# Patient Record
Sex: Female | Born: 1965 | Race: White | Hispanic: No | Marital: Married | Smoking: Never smoker
Health system: Southern US, Community
[De-identification: ages and names within clinical notes are randomized; demographics above are authoritative.]

## PROBLEM LIST (undated history)

## (undated) DIAGNOSIS — Z972 Presence of dental prosthetic device (complete) (partial): Secondary | ICD-10-CM

## (undated) DIAGNOSIS — E7800 Pure hypercholesterolemia, unspecified: Secondary | ICD-10-CM

## (undated) DIAGNOSIS — G43909 Migraine, unspecified, not intractable, without status migrainosus: Secondary | ICD-10-CM

## (undated) DIAGNOSIS — E079 Disorder of thyroid, unspecified: Secondary | ICD-10-CM

## (undated) DIAGNOSIS — T753XXA Motion sickness, initial encounter: Secondary | ICD-10-CM

## (undated) HISTORY — PX: GALLBLADDER SURGERY: SHX652

## (undated) HISTORY — DX: Migraine, unspecified, not intractable, without status migrainosus: G43.909

## (undated) HISTORY — DX: Disorder of thyroid, unspecified: E07.9

## (undated) HISTORY — PX: TUBAL LIGATION: SHX77

## (undated) HISTORY — PX: TONSILLECTOMY AND ADENOIDECTOMY: SHX28

## (undated) HISTORY — PX: BREAST BIOPSY: SHX20

## (undated) HISTORY — DX: Pure hypercholesterolemia, unspecified: E78.00

---

## 2001-08-01 HISTORY — PX: ABDOMINAL HYSTERECTOMY: SHX81

## 2017-03-29 ENCOUNTER — Other Ambulatory Visit: Payer: Self-pay | Admitting: Nurse Practitioner

## 2017-03-29 DIAGNOSIS — Z1231 Encounter for screening mammogram for malignant neoplasm of breast: Secondary | ICD-10-CM

## 2017-04-24 ENCOUNTER — Ambulatory Visit
Admission: RE | Admit: 2017-04-24 | Discharge: 2017-04-24 | Disposition: A | Discharge status: Home / Self Care | Payer: BLUE CROSS/BLUE SHIELD | Source: Ambulatory Visit | Attending: Nurse Practitioner | Admitting: Nurse Practitioner

## 2017-04-24 DIAGNOSIS — Z1231 Encounter for screening mammogram for malignant neoplasm of breast: Secondary | ICD-10-CM | POA: Diagnosis present

## 2017-05-11 ENCOUNTER — Inpatient Hospital Stay
Admission: RE | Admit: 2017-05-11 | Discharge: 2017-05-11 | Disposition: A | Discharge status: Home / Self Care | Payer: Self-pay | Source: Ambulatory Visit | Attending: *Deleted | Admitting: *Deleted

## 2017-05-11 ENCOUNTER — Other Ambulatory Visit: Payer: Self-pay | Admitting: *Deleted

## 2017-05-11 DIAGNOSIS — Z9289 Personal history of other medical treatment: Secondary | ICD-10-CM

## 2017-09-13 ENCOUNTER — Ambulatory Visit (INDEPENDENT_AMBULATORY_CARE_PROVIDER_SITE_OTHER): Payer: BLUE CROSS/BLUE SHIELD | Admitting: Adult Health

## 2017-09-13 ENCOUNTER — Encounter: Payer: Self-pay | Admitting: Adult Health

## 2017-09-13 ENCOUNTER — Encounter (INDEPENDENT_AMBULATORY_CARE_PROVIDER_SITE_OTHER): Payer: Self-pay

## 2017-09-13 ENCOUNTER — Other Ambulatory Visit (HOSPITAL_COMMUNITY)
Admission: RE | Admit: 2017-09-13 | Discharge: 2017-09-13 | Disposition: A | Discharge status: Home / Self Care | Payer: BLUE CROSS/BLUE SHIELD | Source: Ambulatory Visit | Attending: Adult Health | Admitting: Adult Health

## 2017-09-13 VITALS — BP 110/62 | HR 82 | Ht <= 58 in | Wt 124.0 lb

## 2017-09-13 DIAGNOSIS — Z01419 Encounter for gynecological examination (general) (routine) without abnormal findings: Secondary | ICD-10-CM | POA: Diagnosis present

## 2017-09-13 DIAGNOSIS — Z1212 Encounter for screening for malignant neoplasm of rectum: Secondary | ICD-10-CM

## 2017-09-13 DIAGNOSIS — Z08 Encounter for follow-up examination after completed treatment for malignant neoplasm: Secondary | ICD-10-CM | POA: Diagnosis not present

## 2017-09-13 DIAGNOSIS — Z1211 Encounter for screening for malignant neoplasm of colon: Secondary | ICD-10-CM | POA: Diagnosis not present

## 2017-09-13 DIAGNOSIS — Z90710 Acquired absence of both cervix and uterus: Secondary | ICD-10-CM

## 2017-09-13 DIAGNOSIS — K581 Irritable bowel syndrome with constipation: Secondary | ICD-10-CM | POA: Diagnosis not present

## 2017-09-13 DIAGNOSIS — Z01411 Encounter for gynecological examination (general) (routine) with abnormal findings: Secondary | ICD-10-CM | POA: Diagnosis not present

## 2017-09-13 LAB — HEMOCCULT GUIAC POC 1CARD (OFFICE): Fecal Occult Blood, POC: NEGATIVE

## 2017-09-13 MED ORDER — LINACLOTIDE 72 MCG PO CAPS: 72.0000 ug | ORAL_CAPSULE | Freq: Every day | ORAL | 6 refills | Status: DC

## 2017-09-13 NOTE — Addendum Note (Signed)
Addended by: Linton Rump on: 09/13/2017 04:33 PM   Modules accepted: Orders

## 2017-09-13 NOTE — Progress Notes (Signed)
Patient ID: Catherine Foley, female   DOB: 1966-02-04, 52 y.o.   MRN: [id number] History of Present Illness: Catherine Foley is a 52 year old [name], married, sp hysterectomy for ?cervical cancer in 2003.  PCP is Catherine Cara NP at the [facility].   Current Medications, Allergies, Past Medical History, Past Surgical History, Family History and Social History were reviewed in [employer] record.     Review of Systems: Patient denies any headaches, hearing loss, fatigue, blurred vision, shortness of breath, chest pain, abdominal pain, problems with urination, or intercourse. No joint pain or mood swings.+hot flashes +IBS with constipation    Physical Exam:BP 110/62 (BP Location: Left Arm, Patient Position: Sitting, Cuff Size: Normal)   Pulse 82   Ht 4\' 10"  (1.473 m)   Wt 124 lb (56.2 kg)   BMI 25.92 kg/m General:  Well developed, well nourished, no acute distress Skin:  Warm and dry Neck:  Midline trachea, normal thyroid, good ROM, no lymphadenopathy Lungs; Clear to auscultation bilaterally Breast:  No dominant palpable mass, retraction, or nipple discharge Cardiovascular: Regular rate and rhythm Abdomen:  Soft, non tender, no hepatosplenomegaly Pelvic:  External genitalia is normal in appearance, no lesions.  The vagina is normal in appearance. Urethra has no lesions or masses. The cervix and uterus are absent, pap with HPV of vaginal cuff and side walls..  No adnexal masses or tenderness noted.Bladder is non tender, no masses felt. Rectal: Good sphincter tone, no polyps, or hemorrhoids felt.  Hemoccult negative. Extremities/musculoskeletal:  No swelling or varicosities noted, no clubbing or cyanosis,has brace right knee, ?torn tendon from fall Psych:  No mood changes, alert and cooperative,seems happy PHQ 2 score 0  Impression: 1. Encounter for well woman exam with routine gynecological exam   2. Vaginal Pap smear following hysterectomy for malignancy   3.  Screening for colorectal cancer   4. Irritable bowel syndrome with constipation       Plan: Pap sent with HPV Physical in 1 year Mammogram yearly Will request records from [facility] for hysterectomy in 2003 to see if had cervical cancer or not Labs per PCP Colonoscopy per GI

## 2017-09-15 LAB — CYTOLOGY - PAP
DIAGNOSIS: NEGATIVE
HPV (WINDOPATH): NOT DETECTED

## 2018-04-27 ENCOUNTER — Other Ambulatory Visit: Payer: Self-pay | Admitting: Nurse Practitioner

## 2018-04-27 DIAGNOSIS — Z1231 Encounter for screening mammogram for malignant neoplasm of breast: Secondary | ICD-10-CM

## 2018-05-09 ENCOUNTER — Ambulatory Visit
Admission: RE | Admit: 2018-05-09 | Discharge: 2018-05-09 | Disposition: A | Discharge status: Home / Self Care | Payer: BLUE CROSS/BLUE SHIELD | Source: Ambulatory Visit | Attending: Nurse Practitioner | Admitting: Nurse Practitioner

## 2018-05-09 DIAGNOSIS — Z1231 Encounter for screening mammogram for malignant neoplasm of breast: Secondary | ICD-10-CM | POA: Insufficient documentation

## 2019-07-31 ENCOUNTER — Other Ambulatory Visit: Payer: Self-pay | Admitting: Family Medicine

## 2019-07-31 DIAGNOSIS — Z1231 Encounter for screening mammogram for malignant neoplasm of breast: Secondary | ICD-10-CM

## 2019-08-05 ENCOUNTER — Ambulatory Visit
Admission: RE | Admit: 2019-08-05 | Discharge: 2019-08-05 | Disposition: A | Discharge status: Home / Self Care | Payer: 59 | Source: Ambulatory Visit | Attending: Family Medicine | Admitting: Family Medicine

## 2019-08-05 DIAGNOSIS — Z1231 Encounter for screening mammogram for malignant neoplasm of breast: Secondary | ICD-10-CM | POA: Insufficient documentation

## 2019-12-04 ENCOUNTER — Other Ambulatory Visit: Payer: Self-pay | Admitting: Family Medicine

## 2019-12-04 DIAGNOSIS — R0989 Other specified symptoms and signs involving the circulatory and respiratory systems: Secondary | ICD-10-CM

## 2020-03-01 DIAGNOSIS — Z87442 Personal history of urinary calculi: Secondary | ICD-10-CM

## 2020-03-01 DIAGNOSIS — U071 COVID-19: Secondary | ICD-10-CM

## 2020-03-01 HISTORY — DX: COVID-19: U07.1

## 2020-03-01 HISTORY — DX: Personal history of urinary calculi: Z87.442

## 2020-08-10 ENCOUNTER — Other Ambulatory Visit: Payer: Self-pay | Admitting: Family Medicine

## 2020-08-10 DIAGNOSIS — Z1231 Encounter for screening mammogram for malignant neoplasm of breast: Secondary | ICD-10-CM

## 2020-09-03 ENCOUNTER — Other Ambulatory Visit: Payer: Self-pay

## 2020-09-03 ENCOUNTER — Ambulatory Visit
Admission: RE | Admit: 2020-09-03 | Discharge: 2020-09-03 | Disposition: A | Discharge status: Home / Self Care | Payer: 59 | Source: Ambulatory Visit | Attending: Family Medicine | Admitting: Family Medicine

## 2020-09-03 DIAGNOSIS — Z1231 Encounter for screening mammogram for malignant neoplasm of breast: Secondary | ICD-10-CM | POA: Insufficient documentation

## 2020-11-27 ENCOUNTER — Encounter: Payer: Self-pay | Admitting: Cardiovascular Disease

## 2020-12-03 ENCOUNTER — Encounter: Payer: Self-pay | Admitting: Family Medicine

## 2020-12-11 ENCOUNTER — Other Ambulatory Visit: Payer: Self-pay

## 2020-12-11 DIAGNOSIS — Z1211 Encounter for screening for malignant neoplasm of colon: Secondary | ICD-10-CM

## 2020-12-11 MED ORDER — CLENPIQ 10-3.5-12 MG-GM -GM/160ML PO SOLN: 320.0000 mL | ORAL | 0 refills | Status: DC

## 2020-12-14 ENCOUNTER — Other Ambulatory Visit: Payer: Self-pay

## 2020-12-14 MED ORDER — PEG 3350-KCL-NA BICARB-NACL 420 G PO SOLR
ORAL | 0 refills | Status: DC
Start: 2020-12-14 — End: 2021-02-11

## 2020-12-23 ENCOUNTER — Telehealth: Payer: Self-pay

## 2020-12-23 ENCOUNTER — Other Ambulatory Visit: Payer: Self-pay

## 2020-12-23 NOTE — Telephone Encounter (Signed)
Gastroenterology Pre-Procedure Review  Request Date: 01/08/21 Requesting Physician: Dr. Allen Norris  PATIENT REVIEW QUESTIONS: The patient responded to the following health history questions as indicated:    1. Are you having any GI issues? no 2. Do you have a personal history of Polyps? no 3. Do you have a family history of Colon Cancer or Polyps? yes (family members) 4. Diabetes Mellitus? no 5. Joint replacements in the past 12 months?no 6. Major health problems in the past 3 months?no 7. Any artificial heart valves, MVP, or defibrillator?no    MEDICATIONS & ALLERGIES:    Patient reports the following regarding taking any anticoagulation/antiplatelet therapy:   Plavix, Coumadin, Eliquis, Xarelto, Lovenox, Pradaxa, Brilinta, or Effient? no Aspirin? no  Patient confirms/reports the following medications:  Current Outpatient Medications  Medication Sig Dispense Refill  . Cetirizine HCl (ZYRTEC ALLERGY) 10 MG CAPS Take by mouth daily.     . Cholecalciferol (VITAMIN D-1000 MAX ST) 1000 units tablet Take 1,000 Units by mouth daily.     . fenofibrate (TRICOR) 145 MG tablet fenofibrate nanocrystallized 145 mg tablet    . levothyroxine (SYNTHROID, LEVOTHROID) 88 MCG tablet levothyroxine 88 mcg tablet    . linaclotide (LINZESS) 72 MCG capsule Take 1 capsule (72 mcg total) by mouth daily before breakfast. 30 capsule 6  . meloxicam (MOBIC) 7.5 MG tablet Take 7.5 mg by mouth daily.  0  . mometasone (NASONEX) 50 MCG/ACT nasal spray mometasone 50 mcg/actuation nasal spray    . Multiple Vitamin (MULTI-VITAMINS) TABS Take by mouth daily.     . polyethylene glycol-electrolytes (GAVILYTE-N WITH FLAVOR PACK) 420 g solution Drink one 8 oz glass every 20 mins until entire container is finished starting at 5:00pm on 01/07/21 4000 mL 0  . Sod Picosulfate-Mag Ox-Cit Acd (CLENPIQ) 10-3.5-12 MG-GM -GM/160ML SOLN Take 320 mLs by mouth as directed. 320 mL 0  . SUMAtriptan (IMITREX) 25 MG tablet sumatriptan 25 mg tablet     . vitamin E 400 UNIT capsule Take 400 Units by mouth daily.     No current facility-administered medications for this visit.    Patient confirms/reports the following allergies:  No Known Allergies  No orders of the defined types were placed in this encounter.   AUTHORIZATION INFORMATION Primary Insurance: 1D#: Group #:  Secondary Insurance: 1D#: Group #:  SCHEDULE INFORMATION: Date:  Time: Location:

## 2020-12-29 ENCOUNTER — Encounter: Payer: Self-pay | Admitting: Gastroenterology

## 2021-01-07 ENCOUNTER — Other Ambulatory Visit: Payer: Self-pay

## 2021-01-08 ENCOUNTER — Other Ambulatory Visit: Payer: Self-pay

## 2021-01-08 ENCOUNTER — Ambulatory Visit: Payer: 59 | Admitting: Anesthesiology

## 2021-01-08 ENCOUNTER — Encounter
Admission: RE | Disposition: A | Discharge status: Home / Self Care | Payer: Self-pay | Source: Home / Self Care | Attending: Gastroenterology

## 2021-01-08 ENCOUNTER — Encounter: Payer: Self-pay | Admitting: Gastroenterology

## 2021-01-08 ENCOUNTER — Ambulatory Visit
Admission: RE | Admit: 2021-01-08 | Discharge: 2021-01-08 | Disposition: A | Discharge status: Home / Self Care | Payer: 59 | Attending: Gastroenterology | Admitting: Gastroenterology

## 2021-01-08 DIAGNOSIS — K635 Polyp of colon: Secondary | ICD-10-CM | POA: Diagnosis not present

## 2021-01-08 DIAGNOSIS — Z1211 Encounter for screening for malignant neoplasm of colon: Secondary | ICD-10-CM

## 2021-01-08 DIAGNOSIS — Z860100 Personal history of colon polyps, unspecified: Secondary | ICD-10-CM

## 2021-01-08 DIAGNOSIS — K641 Second degree hemorrhoids: Secondary | ICD-10-CM | POA: Diagnosis not present

## 2021-01-08 DIAGNOSIS — Z79899 Other long term (current) drug therapy: Secondary | ICD-10-CM | POA: Insufficient documentation

## 2021-01-08 DIAGNOSIS — Z8616 Personal history of COVID-19: Secondary | ICD-10-CM | POA: Insufficient documentation

## 2021-01-08 DIAGNOSIS — Z79890 Hormone replacement therapy (postmenopausal): Secondary | ICD-10-CM | POA: Diagnosis not present

## 2021-01-08 DIAGNOSIS — D120 Benign neoplasm of cecum: Secondary | ICD-10-CM

## 2021-01-08 HISTORY — DX: Presence of dental prosthetic device (complete) (partial): Z97.2

## 2021-01-08 HISTORY — PX: COLONOSCOPY WITH PROPOFOL: SHX5780

## 2021-01-08 HISTORY — PX: POLYPECTOMY: SHX5525

## 2021-01-08 HISTORY — DX: Motion sickness, initial encounter: T75.3XXA

## 2021-01-08 SURGERY — COLONOSCOPY WITH PROPOFOL
Anesthesia: General | Site: Rectum

## 2021-01-08 MED ORDER — OXYCODONE HCL 5 MG/5ML PO SOLN
5.0000 mg | Freq: Once | ORAL | Status: DC | PRN
Start: 2021-01-08 — End: 2021-01-08

## 2021-01-08 MED ORDER — STERILE WATER FOR IRRIGATION IR SOLN: Status: DC | PRN

## 2021-01-08 MED ORDER — PROPOFOL 10 MG/ML IV BOLUS
INTRAVENOUS | Status: DC | PRN
  Administered 2021-01-08 (×2): 40 mg via INTRAVENOUS
  Administered 2021-01-08: 50 mg via INTRAVENOUS
  Administered 2021-01-08: 100 mg via INTRAVENOUS

## 2021-01-08 MED ORDER — SODIUM CHLORIDE 0.9 % IV SOLN: INTRAVENOUS | Status: DC

## 2021-01-08 MED ORDER — LACTATED RINGERS IV SOLN: INTRAVENOUS | Status: DC

## 2021-01-08 MED ORDER — LIDOCAINE HCL (CARDIAC) PF 100 MG/5ML IV SOSY
PREFILLED_SYRINGE | INTRAVENOUS | Status: DC | PRN
  Administered 2021-01-08: 50 mg via INTRAVENOUS

## 2021-01-08 MED ORDER — OXYCODONE HCL 5 MG PO TABS
5.0000 mg | ORAL_TABLET | Freq: Once | ORAL | Status: DC | PRN
Start: 2021-01-08 — End: 2021-01-08

## 2021-01-08 SURGICAL SUPPLY — 8 items
GOWN CVR UNV OPN BCK APRN NK (MISCELLANEOUS) ×2 IMPLANT
GOWN ISOL THUMB LOOP REG UNIV (MISCELLANEOUS) ×4
KIT PRC NS LF DISP ENDO (KITS) ×1 IMPLANT
KIT PROCEDURE OLYMPUS (KITS) ×2
MANIFOLD NEPTUNE II (INSTRUMENTS) ×2 IMPLANT
SNARE COLD EXACTO (MISCELLANEOUS) ×2 IMPLANT
TRAP ETRAP POLY (MISCELLANEOUS) ×2 IMPLANT
WATER STERILE IRR 250ML POUR (IV SOLUTION) ×2 IMPLANT

## 2021-01-08 NOTE — Transfer of Care (Signed)
Immediate Anesthesia Transfer of Care Note  Patient: Catherine Foley  Procedure(s) Performed: COLONOSCOPY WITH BIOPSY (Rectum) POLYPECTOMY (Rectum)  Patient Location: PACU  Anesthesia Type: General  Level of Consciousness: awake, alert  and patient cooperative  Airway and Oxygen Therapy: Patient Spontanous Breathing and Patient connected to supplemental oxygen  Post-op Assessment: Post-op Vital signs reviewed, Patient's Cardiovascular Status Stable, Respiratory Function Stable, Patent Airway and No signs of Nausea or vomiting  Post-op Vital Signs: Reviewed and stable  Complications: No notable events documented.

## 2021-01-08 NOTE — Anesthesia Procedure Notes (Signed)
Procedure Name: General with mask airway Date/Time: 01/08/2021 10:54 AM Performed by: Cameron Ali, CRNA Pre-anesthesia Checklist: Patient identified, Emergency Drugs available, Suction available, Patient being monitored and Timeout performed Patient Re-evaluated:Patient Re-evaluated prior to induction Oxygen Delivery Method: Nasal cannula Preoxygenation: Pre-oxygenation with 100% oxygen

## 2021-01-08 NOTE — Op Note (Signed)
[facility] Gastroenterology Patient Name: Catherine Foley Procedure Date: 01/08/2021 10:46 AM MRN: [id number] Account #: [id number] Date of Birth: 1966-03-09 Admit Type: Outpatient Age: 55 Room: [facility] OR ROOM 01 Gender: Female Note Status: Finalized Procedure:             Colonoscopy Indications:           High risk colon cancer surveillance: Personal history                         of colonic polyps Providers:             Lucilla Lame MD, MD Referring MD:          Real Cons. Sharlet Salina MD, MD (Referring MD) Medicines:             Propofol per Anesthesia Complications:         No immediate complications. Procedure:             Pre-Anesthesia Assessment:                        - Prior to the procedure, a History and Physical was                         performed, and patient medications and allergies were                         reviewed. The patient's tolerance of previous                         anesthesia was also reviewed. The risks and benefits                         of the procedure and the sedation options and risks                         were discussed with the patient. All questions were                         answered, and informed consent was obtained. Prior                         Anticoagulants: The patient has taken no previous                         anticoagulant or antiplatelet agents. ASA Grade                         Assessment: II - A patient with mild systemic disease.                         After reviewing the risks and benefits, the patient                         was deemed in satisfactory condition to undergo the                         procedure.  After obtaining informed consent, the colonoscope was                         passed under direct vision. Throughout the procedure,                         the patient's blood pressure, pulse, and oxygen                         saturations were monitored continuously. The                          Colonoscope was introduced through the anus and                         advanced to the the cecum, identified by appendiceal                         orifice and ileocecal valve. The colonoscopy was                         performed without difficulty. The patient tolerated                         the procedure well. The quality of the bowel                         preparation was fair. Findings:      The perianal and digital rectal examinations were normal.      A 6 mm polyp was found in the cecum. The polyp was sessile. The polyp       was removed with a cold snare. Resection and retrieval were complete.      Non-bleeding internal hemorrhoids were found during retroflexion. The       hemorrhoids were Grade II (internal hemorrhoids that prolapse but reduce       spontaneously). Impression:            - Preparation of the colon was fair.                        - One 6 mm polyp in the cecum, removed with a cold                         snare. Resected and retrieved.                        - Non-bleeding internal hemorrhoids. Recommendation:        - Discharge patient to home.                        - Resume previous diet.                        - Continue present medications.                        - Await pathology results.                        - Repeat colonoscopy in 5  years for surveillance. Procedure Code(s):     --- Professional ---                        [phone], Colonoscopy, flexible; with removal of                         tumor(s), polyp(s), or other lesion(s) by snare                         technique Diagnosis Code(s):     --- Professional ---                        Z86.010, Personal history of colonic polyps                        K63.5, Polyp of colon CPT copyright 2019 American Medical Association. All rights reserved. The codes documented in this report are preliminary and upon coder review may  be revised to meet current compliance  requirements. Lucilla Lame MD, MD 01/08/2021 11:10:35 AM This report has been signed electronically. Number of Addenda: 0 Note Initiated On: 01/08/2021 10:46 AM Scope Withdrawal Time: 0 hours 8 minutes 11 seconds  Total Procedure Duration: 0 hours 12 minutes 21 seconds  Estimated Blood Loss:  Estimated blood loss: none.      [facility]

## 2021-01-08 NOTE — H&P (Signed)
Lucilla Lame, MD [facility] [address] [place], [place] [address] Phone:[phone] Fax : [phone]  Primary Care Physician:  Bunnie Pion, FNP Primary Gastroenterologist:  Dr. Allen Norris  Pre-Procedure History & Physical: HPI:  Catherine Foley is a 55 y.o. female is here for an colonoscopy.   Past Medical History:  Diagnosis Date   COVID-19 03/2020   High cholesterol    History of kidney stones 03/2020   Migraines    monthly   Motion sickness    ocean boats   Thyroid disease    Wears dentures    partial lower    Past Surgical History:  Procedure Laterality Date   ABDOMINAL HYSTERECTOMY  2003   BREAST BIOPSY     GALLBLADDER SURGERY     TONSILLECTOMY AND ADENOIDECTOMY     TUBAL LIGATION      Prior to Admission medications   Medication Sig Start Date End Date Taking? Authorizing Provider  Ascorbic Acid (VITAMIN C PO) Take by mouth.   Yes [provider]  Cetirizine HCl 10 MG CAPS Take by mouth daily.    Yes [provider]  Cholecalciferol 25 MCG (1000 UT) tablet Take 1,000 Units by mouth daily.    Yes [provider]  fenofibrate (TRICOR) 145 MG tablet fenofibrate nanocrystallized 145 mg tablet 06/13/17  Yes [provider]  levothyroxine (SYNTHROID, LEVOTHROID) 88 MCG tablet levothyroxine 88 mcg tablet 04/10/17  Yes [provider]  mometasone (NASONEX) 50 MCG/ACT nasal spray mometasone 50 mcg/actuation nasal spray   Yes [provider]  Multiple Vitamin (MULTI-VITAMINS) TABS Take by mouth daily.    Yes [provider]  Multiple Vitamins-Minerals (ZINC PO) Take by mouth.   Yes [provider]  SUMAtriptan (IMITREX) 25 MG tablet sumatriptan 25 mg tablet   Yes [provider]  vitamin E 400 UNIT capsule Take 400 Units by mouth daily.   Yes [provider]  polyethylene glycol-electrolytes (GAVILYTE-N WITH FLAVOR PACK) 420 g solution Drink one 8 oz glass every 20 mins  until entire container is finished starting at 5:00pm on 01/07/21 12/14/20   Lucilla Lame, MD  Sod Picosulfate-Mag Ox-Cit Acd (CLENPIQ) 10-3.5-12 MG-GM -GM/160ML SOLN Take 320 mLs by mouth as directed. 12/11/20   Lucilla Lame, MD    Allergies as of 12/11/2020   (No Known Allergies)    Family History  Problem Relation Age of Onset   Breast cancer Maternal Aunt    Heart attack Paternal Grandfather    Alzheimer's disease Paternal Grandmother    Heart attack Maternal Grandmother    Other Father        black lung   Hypertension Father    Other Mother        too many antibiotics caused infection   Hypertension Mother    Hypertension Brother    Hypertension Brother    Diabetes Daughter    Hypertension Other        on both sides of family    Social History   Socioeconomic History   Marital status: Married    Spouse name: Not on file   Number of children: Not on file   Years of education: Not on file   Highest education level: Not on file  Occupational History   Not on file  Tobacco Use   Smoking status: Never   Smokeless tobacco: Never  Vaping Use   Vaping Use: Never used  Substance and Sexual Activity   Alcohol use: No   Drug  use: No   Sexual activity: Yes    Birth control/protection: Surgical    Comment: hyst  Other Topics Concern   Not on file  Social History Narrative   Not on file   Social Determinants of Health   Financial Resource Strain: Not on file  Food Insecurity: Not on file  Transportation Needs: Not on file  Physical Activity: Not on file  Stress: Not on file  Social Connections: Not on file  Intimate Partner Violence: Not on file    Review of Systems: See HPI, otherwise negative ROS  Physical Exam: Ht 4\' 10"  (1.473 m)   Wt 61.2 kg   BMI 28.22 kg/m  General:   Alert,  pleasant and cooperative in NAD Head:  Normocephalic and atraumatic. Neck:  Supple; no masses or thyromegaly. Lungs:  Clear throughout to auscultation.    Heart:  Regular  rate and rhythm. Abdomen:  Soft, nontender and nondistended. Normal bowel sounds, without guarding, and without rebound.   Neurologic:  Alert and  oriented x4;  grossly normal neurologically.  Impression/Plan: Catherine Foley is here for an colonoscopy to be performed for  a history of adenomatous polyps on 2016   Risks, benefits, limitations, and alternatives regarding  colonoscopy have been reviewed with the patient.  Questions have been answered.  All parties agreeable.   Lucilla Lame, MD  01/08/2021, 10:02 AM

## 2021-01-08 NOTE — Anesthesia Preprocedure Evaluation (Signed)
Anesthesia Evaluation  Patient identified by MRN, date of birth, ID band Patient awake    Reviewed: NPO status   History of Anesthesia Complications Negative for: history of anesthetic complications  Airway Mallampati: II  TM Distance: >3 FB Neck ROM: full    Dental  (+) Partial Lower   Pulmonary neg pulmonary ROS,    Pulmonary exam normal        Cardiovascular Exercise Tolerance: Good negative cardio ROS Normal cardiovascular exam     Neuro/Psych  Headaches, negative psych ROS   GI/Hepatic Neg liver ROS, GERD  Controlled,  Endo/Other  Hypothyroidism   Renal/GU negative Renal ROS  negative genitourinary   Musculoskeletal   Abdominal   Peds  Hematology negative hematology ROS (+)   Anesthesia Other Findings COVID-19 03/2020;   Bmi=28;  Reproductive/Obstetrics                             Anesthesia Physical Anesthesia Plan  ASA: 2  Anesthesia Plan: General   Post-op Pain Management:    Induction:   PONV Risk Score and Plan: 3 and Propofol infusion, TIVA and Ondansetron  Airway Management Planned:   Additional Equipment:   Intra-op Plan:   Post-operative Plan:   Informed Consent: I have reviewed the patients History and Physical, chart, labs and discussed the procedure including the risks, benefits and alternatives for the proposed anesthesia with the patient or authorized representative who has indicated his/her understanding and acceptance.       Plan Discussed with: CRNA  Anesthesia Plan Comments:         Anesthesia Quick Evaluation

## 2021-01-08 NOTE — Anesthesia Postprocedure Evaluation (Signed)
Anesthesia Post Note  Patient: Catherine Foley  Procedure(s) Performed: COLONOSCOPY WITH BIOPSY (Rectum) POLYPECTOMY (Rectum)     Patient location during evaluation: PACU Anesthesia Type: General Level of consciousness: awake and alert Pain management: pain level controlled Vital Signs Assessment: post-procedure vital signs reviewed and stable Respiratory status: spontaneous breathing, nonlabored ventilation, respiratory function stable and patient connected to nasal cannula oxygen Cardiovascular status: blood pressure returned to baseline and stable Postop Assessment: no apparent nausea or vomiting Anesthetic complications: no   No notable events documented.  Fidel Levy

## 2021-01-11 ENCOUNTER — Encounter: Payer: Self-pay | Admitting: Gastroenterology

## 2021-01-11 ENCOUNTER — Ambulatory Visit: Payer: 59 | Admitting: Cardiology

## 2021-01-11 LAB — SURGICAL PATHOLOGY

## 2021-01-14 ENCOUNTER — Encounter: Payer: Self-pay | Admitting: Gastroenterology

## 2021-02-11 ENCOUNTER — Ambulatory Visit: Payer: 59 | Admitting: Cardiovascular Disease

## 2021-02-11 ENCOUNTER — Encounter: Payer: Self-pay | Admitting: Cardiovascular Disease

## 2021-02-11 ENCOUNTER — Other Ambulatory Visit: Payer: Self-pay

## 2021-02-11 VITALS — BP 120/80 | HR 85 | Ht 59.0 in | Wt 141.1 lb

## 2021-02-11 DIAGNOSIS — U099 Post covid-19 condition, unspecified: Secondary | ICD-10-CM

## 2021-02-11 DIAGNOSIS — I209 Angina pectoris, unspecified: Secondary | ICD-10-CM | POA: Diagnosis not present

## 2021-02-11 DIAGNOSIS — R0602 Shortness of breath: Secondary | ICD-10-CM

## 2021-02-11 DIAGNOSIS — E785 Hyperlipidemia, unspecified: Secondary | ICD-10-CM | POA: Diagnosis not present

## 2021-02-11 MED ORDER — METOPROLOL TARTRATE 100 MG PO TABS: ORAL_TABLET | ORAL | 0 refills | Status: AC

## 2021-02-11 NOTE — Patient Instructions (Signed)
Medication Instructions:  Your physician recommends that you continue on your current medications as directed. Please refer to the Current Medication list given to you today.  A one time does of Metoprolol 100mg  to be taken 2 hours prior to your Cardiac CTA has been sent to your pharmacy.  *If you need a refill on your cardiac medications before your next appointment, please call your pharmacy*   Lab Work: Your physician recommends that you return for lab work (bmp) 2-3 days prior to your Cardiac CTA. Please report to the [facility] medical mall lab. No appt needed. Lab hours are Mon-Fri 7:30am-6pm.   If you have labs (blood work) drawn today and your tests are completely normal, you will receive your results only by: [place] (if you have MyChart) OR A paper copy in the mail If you have any lab test that is abnormal or we need to change your treatment, we will call you to review the results.   Testing/Procedures: Your physician has requested that you have an echocardiogram. Echocardiography is a painless test that uses sound waves to create images of your heart. It provides your doctor with information about the size and shape of your heart and how well your heart's chambers and valves are working. This procedure takes approximately one hour. There are no restrictions for this procedure.  Your physician has requested that you have cardiac CT. Cardiac computed tomography (CT) is a painless test that uses an x-ray machine to take clear, detailed pictures of your heart. For further information please visit [email or web address]. Please follow instruction sheet as given.     Follow-Up: At [facility], you and your health needs are our priority.  As part of our continuing mission to provide you with exceptional heart care, we have created designated Provider Care Teams.  These Care Teams include your primary Cardiologist (physician) and Advanced Practice Providers (APPs -  Physician  Assistants and Nurse Practitioners) who all work together to provide you with the care you need, when you need it.  We recommend signing up for the patient portal called "MyChart".  Sign up information is provided on this After Visit Summary.  MyChart is used to connect with patients for Virtual Visits (Telemedicine).  Patients are able to view lab/test results, encounter notes, upcoming appointments, etc.  Non-urgent messages can be sent to your provider as well.   To learn more about what you can do with MyChart, go to [email or web address].    Your next appointment:   As needed  The format for your next appointment:   In Person  Provider:   You may see Kathlyn Sacramento, MD or one of the following Advanced Practice Providers on your designated Care Team:   Murray Hodgkins, NP Christell Faith, PA-C Marrianne Mood, PA-C Cadence Kathlen Mody, [place]   Other Instructions    Your cardiac CT will be scheduled at one of the below locations:   [facility] [address] [place], [place] [address] [phone]  [place] [address] [place], [place] [address] [phone]  If scheduled at [facility], please arrive at the [facility] main entrance (entrance A) of [facility] 30 minutes prior to test start time. Proceed to the [facility] Radiology Department (first floor) to check-in and test prep.  If scheduled at [facility], please arrive 15 mins early for check-in and test prep.  Please follow these instructions carefully (unless otherwise directed):  On the Night Before the Test: Be sure to Drink plenty of water. Do not consume any caffeinated/decaffeinated beverages or chocolate 12 hours prior to your test. Do not take any antihistamines 12 hours prior to your test.  On the Day of the Test: Drink plenty of water until 1 hour prior to the test. Do not eat any food 4  hours prior to the test. You may take your regular medications prior to the test.  Take metoprolol (Lopressor) two hours prior to test. FEMALES- please wear underwire-free bra if available, avoid dresses & tight clothing         After the Test: Drink plenty of water. After receiving IV contrast, you may experience a mild flushed feeling. This is normal. On occasion, you may experience a mild rash up to 24 hours after the test. This is not dangerous. If this occurs, you can take Benadryl 25 mg and increase your fluid intake. If you experience trouble breathing, this can be serious. If it is severe call 911 IMMEDIATELY. If it is mild, please call our office. If you take any of these medications: Glipizide/Metformin, Avandament, Glucavance, please do not take 48 hours after completing test unless otherwise instructed.  Please allow 2-4 weeks for scheduling of routine cardiac CTs. Some insurance companies require a pre-authorization which may delay scheduling of this test.   For non-scheduling related questions, please contact the cardiac imaging nurse navigator should you have any questions/concerns: Marchia Bond, Cardiac Imaging Nurse Navigator Gordy Clement, Cardiac Imaging Nurse Navigator [facility] Direct Office Dial: [phone]   For scheduling needs, including cancellations and rescheduling, please call [place], [phone].

## 2021-02-11 NOTE — Progress Notes (Signed)
Cardiology Office Note   Date:  02/11/2021   ID:  Jupiter, Boys 12-27-1965, MRN [id number]  PCP:  Bunnie Pion, FNP  Cardiologist:   Kathlyn Sacramento, MD   Chief Complaint  Patient presents with   Other    C/o Hyper triglycerides, sob and fatigue. Meds reviewed verbally with pt.      History of Present Illness: Catherine Foley is a 55 y.o. female who was referred by nurse practitioner Hendricks Milo for evaluation of exertional dyspnea.  She has no prior cardiac history.  She has known history of hyperlipidemia, hypothyroidism and prediabetes.  She is not a smoker but does have family history of premature coronary artery disease. She had COVID-19 infection in August of last year and was extremely sick with pneumonia.  She was hospitalized for 2 weeks and then well.  She required high flow oxygen but was not intubated.  Since her COVID infection, she never regained her strength and stamina.  She describes dyspnea with minimal exertion with no chest pain.  She feels fatigued all the time.  She does not exercise on a regular basis.  No orthopnea, PND or leg edema.  She reports no previous cardiac work-up. She works at [employer] in [place].    Past Medical History:  Diagnosis Date   COVID-19 03/2020   High cholesterol    History of kidney stones 03/2020   Migraines    monthly   Motion sickness    ocean boats   Thyroid disease    Wears dentures    partial lower    Past Surgical History:  Procedure Laterality Date   ABDOMINAL HYSTERECTOMY  2003   BREAST BIOPSY     COLONOSCOPY WITH PROPOFOL N/A 01/08/2021   Procedure: COLONOSCOPY WITH BIOPSY;  Surgeon: Lucilla Lame, MD;  Location: [facility];  Service: Endoscopy;  Laterality: N/A;   GALLBLADDER SURGERY     POLYPECTOMY N/A 01/08/2021   Procedure: POLYPECTOMY;  Surgeon: Lucilla Lame, MD;  Location: [facility];  Service: Endoscopy;  Laterality: N/A;   TONSILLECTOMY AND ADENOIDECTOMY      TUBAL LIGATION       Current Outpatient Medications  Medication Sig Dispense Refill   Ascorbic Acid (VITAMIN C PO) Take by mouth.     Cetirizine HCl 10 MG CAPS Take by mouth daily.      Cholecalciferol 25 MCG (1000 UT) tablet Take 1,000 Units by mouth daily.      fenofibrate (TRICOR) 145 MG tablet fenofibrate nanocrystallized 145 mg tablet     levothyroxine (SYNTHROID, LEVOTHROID) 88 MCG tablet levothyroxine 88 mcg tablet     mometasone (NASONEX) 50 MCG/ACT nasal spray mometasone 50 mcg/actuation nasal spray     Multiple Vitamin (MULTI-VITAMINS) TABS Take by mouth daily.      Multiple Vitamins-Minerals (ZINC PO) Take by mouth.     SUMAtriptan (IMITREX) 25 MG tablet sumatriptan 25 mg tablet     vitamin E 400 UNIT capsule Take 400 Units by mouth daily.     No current facility-administered medications for this visit.    Allergies:   Patient has no known allergies.    Social History:  The patient  reports that she has never smoked. She has never used smokeless tobacco. She reports that she does not drink alcohol and does not use drugs.   Family History:  The patient's family history includes Alzheimer's disease in her paternal grandmother; Breast cancer in her maternal aunt; Diabetes in her daughter; Heart  Problems in her paternal grandmother; Heart attack in her maternal grandmother and paternal grandfather; Hypertension in her brother, brother, father, mother, and another family member; Other in her father and mother.    ROS:  Please see the history of present illness.   Otherwise, review of systems are positive for none.   All other systems are reviewed and negative.    PHYSICAL EXAM: VS:  BP 120/80 (BP Location: Right Arm, Patient Position: Sitting, Cuff Size: Normal)   Pulse 85   Ht 4\' 11"  (1.499 m)   Wt 141 lb 2 oz (64 kg)   SpO2 98%   BMI 28.50 kg/m  , BMI Body mass index is 28.5 kg/m. GEN: Well nourished, well developed, in no acute distress  HEENT: normal  Neck: no  JVD, carotid bruits, or masses Cardiac: RRR; no murmurs, rubs, or gallops,no edema  Respiratory:  clear to auscultation bilaterally, normal work of breathing GI: soft, nontender, nondistended, + BS MS: no deformity or atrophy  Skin: warm and dry, no rash Neuro:  Strength and sensation are intact Psych: euthymic mood, full affect   EKG:  EKG is ordered today. The ekg ordered today demonstrates normal sinus rhythm with no significant ST or T wave changes.   Recent Labs: No results found for requested labs within last 8760 hours.    Lipid Panel No results found for: CHOL, TRIG, HDL, CHOLHDL, VLDL, LDLCALC, LDLDIRECT    Wt Readings from Last 3 Encounters:  02/11/21 141 lb 2 oz (64 kg)  01/08/21 (P) 136 lb (61.7 kg)  09/13/17 124 lb (56.2 kg)       PAD Screen 02/11/2021  Previous PAD dx? No  Previous surgical procedure? No  Pain with walking? Yes  Subsides with rest? Yes  Feet/toe relief with dangling? No  Painful, non-healing ulcers? No  Extremities discolored? No      ASSESSMENT AND PLAN:  1.  Dyspnea with minimal exertion: Possible anginal equivalent given her multiple risk factors for coronary artery disease.  Recommend evaluation with cardiac CTA and FFR if needed.  In addition, I requested an echocardiogram to evaluate LV systolic function.  I explained to her the correlation between COVID infection and cardiac complications.  2.  Hypothyroidism: On replacement therapy.  3.  Hyperlipidemia: Mostly elevated triglycerides currently on Tricor.  If cardiac CTA shows significant atherosclerosis, she will require treatment with a statin.  4.  Status post COVID-19 infection in August of last year she does exhibit some of the symptoms of long COVID.    Disposition:   FU with me as needed.  Signed,  Kathlyn Sacramento, MD  02/11/2021 3:09 PM    [place]

## 2021-02-24 ENCOUNTER — Telehealth (HOSPITAL_COMMUNITY): Payer: Self-pay | Admitting: *Deleted

## 2021-02-24 NOTE — Telephone Encounter (Signed)
Attempted to call patient regarding upcoming cardiac CT appointment. °Left message on voicemail with name and callback number ° °Brookelyn Gaynor RN Navigator Cardiac Imaging °[facility] °[phone] Office °[phone] Cell ° °

## 2021-02-25 ENCOUNTER — Other Ambulatory Visit: Payer: Self-pay

## 2021-02-25 ENCOUNTER — Ambulatory Visit
Admission: RE | Admit: 2021-02-25 | Discharge: 2021-02-25 | Disposition: A | Discharge status: Home / Self Care | Payer: 59 | Source: Ambulatory Visit | Attending: Cardiovascular Disease | Admitting: Cardiovascular Disease

## 2021-02-25 DIAGNOSIS — I209 Angina pectoris, unspecified: Secondary | ICD-10-CM | POA: Diagnosis not present

## 2021-02-25 MED ORDER — NITROGLYCERIN 0.4 MG SL SUBL
0.4000 mg | SUBLINGUAL_TABLET | Freq: Once | SUBLINGUAL | Status: AC
  Administered 2021-02-25: 0.4 mg via SUBLINGUAL

## 2021-02-25 MED ORDER — METOPROLOL TARTRATE 5 MG/5ML IV SOLN
10.0000 mg | Freq: Once | INTRAVENOUS | Status: AC
  Administered 2021-02-25: 10 mg via INTRAVENOUS

## 2021-02-25 MED ORDER — DILTIAZEM HCL 25 MG/5ML IV SOLN
10.0000 mg | Freq: Once | INTRAVENOUS | Status: AC
  Administered 2021-02-25: 10 mg via INTRAVENOUS

## 2021-02-25 MED ORDER — NITROGLYCERIN 0.4 MG SL SUBL: 0.8000 mg | SUBLINGUAL_TABLET | Freq: Once | SUBLINGUAL | Status: DC

## 2021-02-25 MED ORDER — IOHEXOL 350 MG/ML SOLN
80.0000 mL | Freq: Once | INTRAVENOUS | Status: AC | PRN
  Administered 2021-02-25: 80 mL via INTRAVENOUS

## 2021-02-25 NOTE — Progress Notes (Signed)
Spoke with Dr Garen Lah regarding patient's blood pressure and heart rate for cardiac CT after initial medication administration. Instead of Nitro 0.8 mg administer Nitro 0.4 mg and give Cardizem 10 mg IV after. Patient tolerated CT well Ambulated to recliner steady gait Gave water, soda, and crackers to eat after. Resting in chair  Ambulatory steady gait to exit.

## 2021-03-02 ENCOUNTER — Telehealth: Payer: Self-pay

## 2021-03-02 NOTE — Telephone Encounter (Signed)
Patient is returning your call. Patient asks if the call can be returned between 12:30-1 or after 4:30

## 2021-03-02 NOTE — Telephone Encounter (Signed)
Patient made aware of Cardiac CTA results with verbalized understanding.

## 2021-03-02 NOTE — Telephone Encounter (Signed)
-----   Message from Wellington Hampshire, MD sent at 03/01/2021  5:50 PM EDT ----- Inform patient that cardiac CTA was normal with no evidence of any blockages or atherosclerosis.  This is great news. CT lungs did show some changes likely related to her COVID infection last year. Forward a copy to primary care physician.

## 2021-03-02 NOTE — Telephone Encounter (Signed)
Called to give the patient Cardiac CT results. Lmtcb.

## 2021-04-02 ENCOUNTER — Other Ambulatory Visit: Payer: Self-pay

## 2021-04-02 ENCOUNTER — Ambulatory Visit (INDEPENDENT_AMBULATORY_CARE_PROVIDER_SITE_OTHER): Payer: 59

## 2021-04-02 DIAGNOSIS — R0602 Shortness of breath: Secondary | ICD-10-CM | POA: Diagnosis not present

## 2021-04-02 LAB — ECHOCARDIOGRAM COMPLETE
AR max vel: 3.02 cm2
AV Area VTI: 2.93 cm2
AV Area mean vel: 3.23 cm2
AV Mean grad: 2 mmHg
AV Peak grad: 3.2 mmHg
Ao pk vel: 0.9 m/s
Area-P 1/2: 2.36 cm2
Calc EF: 58.1 %
S' Lateral: 2.4 cm
Single Plane A2C EF: 59 %
Single Plane A4C EF: 57.1 %

## 2021-04-07 ENCOUNTER — Telehealth: Payer: Self-pay

## 2021-04-07 NOTE — Telephone Encounter (Signed)
-----   Message from Wellington Hampshire, MD sent at 04/06/2021 11:52 AM EDT ----- Inform patient that echo was fine.  Normal ejection fraction with only mild mitral regurgitation.  No significant findings.

## 2021-04-07 NOTE — Telephone Encounter (Signed)
Patient returning call  Would like a call between 12:30p and 1:00p

## 2021-04-07 NOTE — Telephone Encounter (Signed)
Patient returning call.  She has reviewed results on my chart and states results looked good to her from the note.   Patient aware she can call if she has any questions or concerns.

## 2021-04-07 NOTE — Telephone Encounter (Signed)
Called to give the patient echo results. Lmtcb. 

## 2021-07-21 ENCOUNTER — Other Ambulatory Visit: Payer: Self-pay | Admitting: Family Medicine

## 2021-07-21 DIAGNOSIS — Z1231 Encounter for screening mammogram for malignant neoplasm of breast: Secondary | ICD-10-CM

## 2021-09-06 ENCOUNTER — Other Ambulatory Visit: Payer: Self-pay

## 2021-09-06 ENCOUNTER — Ambulatory Visit
Admission: RE | Admit: 2021-09-06 | Discharge: 2021-09-06 | Disposition: A | Discharge status: Home / Self Care | Payer: 59 | Source: Ambulatory Visit | Attending: Family Medicine | Admitting: Family Medicine

## 2021-09-06 DIAGNOSIS — Z1231 Encounter for screening mammogram for malignant neoplasm of breast: Secondary | ICD-10-CM | POA: Insufficient documentation

## 2022-07-12 ENCOUNTER — Other Ambulatory Visit: Payer: Self-pay | Admitting: Family Medicine

## 2022-07-12 DIAGNOSIS — Z1231 Encounter for screening mammogram for malignant neoplasm of breast: Secondary | ICD-10-CM

## 2022-09-07 ENCOUNTER — Ambulatory Visit
Admission: RE | Admit: 2022-09-07 | Discharge: 2022-09-07 | Disposition: A | Discharge status: Home / Self Care | Payer: 59 | Source: Ambulatory Visit | Attending: Family Medicine | Admitting: Family Medicine

## 2022-09-07 DIAGNOSIS — Z1231 Encounter for screening mammogram for malignant neoplasm of breast: Secondary | ICD-10-CM | POA: Diagnosis present

## 2022-11-28 IMAGING — CT CT HEART MORP W/ CTA COR W/ SCORE W/ CA W/CM &/OR W/O CM
1 of 16 series · 2 of 20 positions shown, 3 images · non-contrast
Comparison: None.

Addendum:
CLINICAL DATA: Dyspnea on exertion

EXAM:
Cardiac/Coronary  CTA
TECHNIQUE: The patient was scanned on a Siemens Somatom go.Top scanner.

[Series 43: multiphase % cta coronary 0.60 · axial · 0.32mm/px · z∈[-1045,-1006]mm · 2 of 3234 slices shown, 3 images]
[im 1078/3234  vessel]
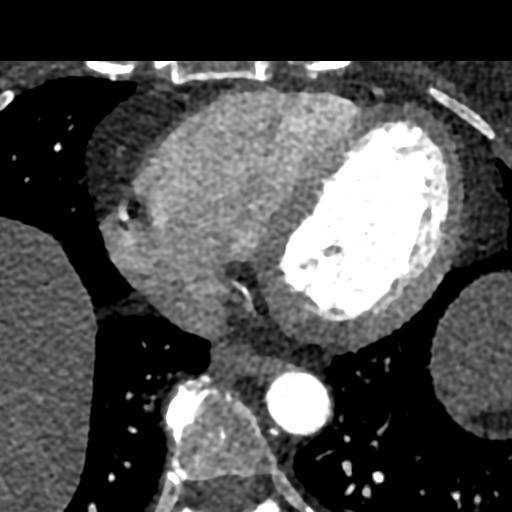
[im 1078/3234  lung]
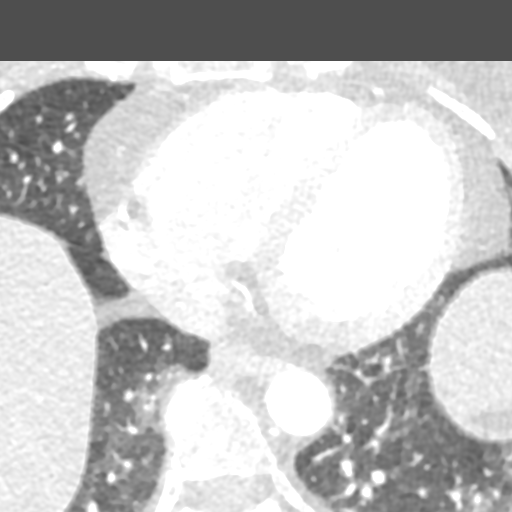
[im 2156/3234  vessel]
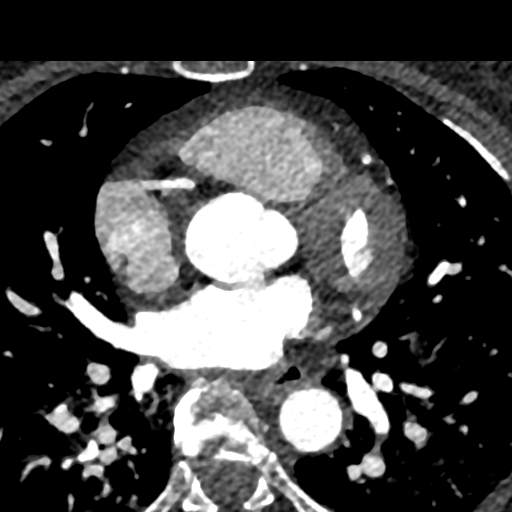

[2 of 20 positions shown; findings below may reference images not displayed]



Aortic Valve:  Trileaflet.  No calcifications.

Coronary Arteries:  Normal coronary origin.  Right dominance.

RCA is a dominant artery that gives rise to PDA and PLA. There is no
plaque.

Left main has no disease.  It gives rise to LAD and LCX arteries.

LAD has no plaque.

LCX is a non-dominant artery that gives rise to one OM1 branch.
There is no plaque.

Other findings:

Normal pulmonary vein drainage into the left atrium.

Normal left atrial appendage without a thrombus.

Normal size of the pulmonary artery.
IMPRESSION: 1. Normal coronary calcium score of 0. Patient is low risk for
coronary events.

2. Normal coronary origin with right dominance.

3. No evidence of CAD.

4. CAD-RADS 0. Consider non-atherosclerotic causes of chest pain.

EXAM:
OVER-READ INTERPRETATION  CT CHEST

The following report is an over-read performed by radiologist Dr.
does not include interpretation of cardiac or coronary anatomy or
pathology. The coronary CTA interpretation by the cardiologist is
attached.
FINDINGS: Visualized pulmonary arteries are patent. Visualized mediastinal
structures are normal. The visualized liver has slightly low
attenuation. Scattered ground-glass attenuation in the visualized
lungs. No pleural effusions. No large areas of consolidation in the
visualized lungs. No acute bone abnormality.
IMPRESSION: Scattered areas of ground-glass attenuation in the lungs. These
areas of ground-glass attenuation are predominantly in the lower
lobes suggesting the findings could be associated with atelectasis.
Asymmetric pulmonary edema and post inflammatory changes are also in
the differential diagnosis.



Aortic Valve:  Trileaflet.  No calcifications.

Coronary Arteries:  Normal coronary origin.  Right dominance.

RCA is a dominant artery that gives rise to PDA and PLA. There is no
plaque.

Left main has no disease.  It gives rise to LAD and LCX arteries.

LAD has no plaque.

LCX is a non-dominant artery that gives rise to one OM1 branch.
There is no plaque.

Other findings:

Normal pulmonary vein drainage into the left atrium.

Normal left atrial appendage without a thrombus.

Normal size of the pulmonary artery.
IMPRESSION: 1. Normal coronary calcium score of 0. Patient is low risk for
coronary events.

2. Normal coronary origin with right dominance.

3. No evidence of CAD.

4. CAD-RADS 0. Consider non-atherosclerotic causes of chest pain.

## 2023-06-15 ENCOUNTER — Other Ambulatory Visit (HOSPITAL_COMMUNITY)
Admission: RE | Admit: 2023-06-15 | Discharge: 2023-06-15 | Disposition: A | Discharge status: Home / Self Care | Payer: 59 | Source: Ambulatory Visit | Attending: Adult Health | Admitting: Adult Health

## 2023-06-15 ENCOUNTER — Ambulatory Visit: Payer: 59 | Admitting: Adult Health

## 2023-06-15 ENCOUNTER — Encounter: Payer: Self-pay | Admitting: Adult Health

## 2023-06-15 VITALS — BP 128/82 | HR 86 | Ht 59.0 in | Wt 145.0 lb

## 2023-06-15 DIAGNOSIS — Z01419 Encounter for gynecological examination (general) (routine) without abnormal findings: Secondary | ICD-10-CM | POA: Diagnosis not present

## 2023-06-15 DIAGNOSIS — Z1211 Encounter for screening for malignant neoplasm of colon: Secondary | ICD-10-CM

## 2023-06-15 DIAGNOSIS — Z90710 Acquired absence of both cervix and uterus: Secondary | ICD-10-CM

## 2023-06-15 DIAGNOSIS — Z08 Encounter for follow-up examination after completed treatment for malignant neoplasm: Secondary | ICD-10-CM | POA: Diagnosis present

## 2023-06-15 DIAGNOSIS — N941 Unspecified dyspareunia: Secondary | ICD-10-CM | POA: Insufficient documentation

## 2023-06-15 DIAGNOSIS — Z1331 Encounter for screening for depression: Secondary | ICD-10-CM

## 2023-06-15 LAB — HEMOCCULT GUIAC POC 1CARD (OFFICE): Fecal Occult Blood, POC: NEGATIVE

## 2023-06-15 NOTE — Progress Notes (Signed)
Patient ID: Catherine Foley, female   DOB: 09-Jan-1966, 57 y.o.   MRN: [id number] History of Present Illness: Catherine Foley is a 57 year old female, married, sp hysterectomy in 2003 for ?cervical cancer, in for well woman gyn exam and pap. She was last seen in 2019 and requested records from hysterectomy but never received. She is on veozah from PCP for hot flashes. And has pain with sex on left side of vagina, and she says she has gained some weight.   PCP is Lorenda Cahill FNP at [facility].    Current Medications, Allergies, Past Medical History, Past Surgical History, Family History and Social History were reviewed in [employer] record.     Review of Systems: Patient denies any headaches, hearing loss, fatigue, blurred vision, shortness of breath, chest pain, abdominal pain, problems with bowel movements,or urination.  No joint pain or mood swings.  See HPI for positives.    Physical Exam:BP 128/82 (BP Location: Left Arm, Patient Position: Sitting, Cuff Size: Normal)   Pulse 86   Ht 4\' 11"  (1.499 m)   Wt 145 lb (65.8 kg)   BMI 29.29 kg/m   General:  Well developed, well nourished, no acute distress Skin:  Warm and dry Neck:  Midline trachea, normal thyroid, good ROM, no lymphadenopathy Lungs; Clear to auscultation bilaterally Breast:  No dominant palpable mass, retraction, or nipple discharge Cardiovascular: Regular rate and rhythm Abdomen:  Soft, non tender, no hepatosplenomegaly Pelvic:  External genitalia is normal in appearance, no lesions.  The vagina is normal in appearance, no lesions and vaginal cuff is smooth,no pain on exam. Urethra has no lesions or masses. The cervix and uterus are absent. No adnexal masses or tenderness noted.Bladder is non tender, no masses felt. Rectal: Good sphincter tone, no polyps, or hemorrhoids felt.  Hemoccult negative. Extremities/musculoskeletal:  No swelling or varicosities noted, no clubbing or cyanosis Psych:  No mood  changes, alert and cooperative,seems happy AA is 0 Fall risk is low    06/15/2023    8:25 AM 09/13/2017    3:44 PM  Depression screen PHQ 2/9  Decreased Interest 0 0  Down, Depressed, Hopeless 0 0  PHQ - 2 Score 0 0  Altered sleeping 0   Tired, decreased energy 1   Change in appetite 0   Feeling bad or failure about yourself  0   Trouble concentrating 0   Moving slowly or fidgety/restless 0   Suicidal thoughts 0   PHQ-9 Score 1        06/15/2023    8:25 AM  GAD 7 : Generalized Anxiety Score  Nervous, Anxious, on Edge 0  Control/stop worrying 0  Worry too much - different things 0  Trouble relaxing 0  Restless 0  Easily annoyed or irritable 0  Afraid - awful might happen 0  Total GAD 7 Score 0      Upstream - 06/15/23 0836       Pregnancy Intention Screening   Does the patient want to become pregnant in the next year? No    Does the patient's partner want to become pregnant in the next year? No    Would the patient like to discuss contraceptive options today? No      Contraception Wrap Up   Current Method Female Sterilization   hyst   End Method Female Sterilization   hyst   Contraception Counseling Provided No             Examination chaperoned by  Freddie Apley RN   Impression and Plan: 1. Encounter for well woman exam with routine gynecological exam Pap and physical in 1 year Labs with PCP Mammogram was negative 09/07/22 Colonoscopy was 2023 and gets another in 5 years had polyps She rides a stationary bike Try to eat lean and green to help with weight loss, and triglycerides   2. Vaginal Pap smear following hysterectomy for malignancy Pap sent Pa[ in 1 year - Cytology - PAP( [facility])  3. Encounter for screening fecal occult blood testing Hemoccult was negtive  - POCT occult blood stool  4. Dyspareunia, female Has pain on left side of vagina with sex, husband does have curve in penis, pay attention to see if positional and use good lubricate    5. S/P hysterectomy Had in 2003 for ?cervical cancer Will request records again

## 2023-06-16 LAB — CYTOLOGY - PAP
Comment: NEGATIVE
Diagnosis: NEGATIVE
High risk HPV: NEGATIVE

## 2023-06-19 ENCOUNTER — Other Ambulatory Visit: Payer: Self-pay | Admitting: Adult Health

## 2023-06-19 MED ORDER — METRONIDAZOLE 500 MG PO TABS: 500.0000 mg | ORAL_TABLET | Freq: Two times a day (BID) | ORAL | 0 refills | Status: AC

## 2023-06-19 NOTE — Progress Notes (Signed)
+  BV on pap will rx flagyl, no sex or alcohol while taking  

## 2023-08-10 ENCOUNTER — Other Ambulatory Visit: Payer: Self-pay | Admitting: Family Medicine

## 2023-08-10 DIAGNOSIS — Z1231 Encounter for screening mammogram for malignant neoplasm of breast: Secondary | ICD-10-CM

## 2023-09-11 ENCOUNTER — Ambulatory Visit
Admission: RE | Admit: 2023-09-11 | Discharge: 2023-09-11 | Disposition: A | Discharge status: Home / Self Care | Payer: 59 | Source: Ambulatory Visit | Attending: Family Medicine | Admitting: Family Medicine

## 2023-09-11 DIAGNOSIS — Z1231 Encounter for screening mammogram for malignant neoplasm of breast: Secondary | ICD-10-CM | POA: Insufficient documentation

## 2024-07-31 ENCOUNTER — Other Ambulatory Visit: Payer: Self-pay | Admitting: Family Medicine

## 2024-07-31 DIAGNOSIS — Z1231 Encounter for screening mammogram for malignant neoplasm of breast: Secondary | ICD-10-CM

## 2024-09-12 ENCOUNTER — Ambulatory Visit
